# Patient Record
Sex: Female | Born: 1995 | Race: White | Marital: Single | Smoking: Never smoker
Health system: Southern US, Community
[De-identification: ages and names within clinical notes are randomized; demographics above are authoritative.]

## PROBLEM LIST (undated history)

## (undated) DIAGNOSIS — G43909 Migraine, unspecified, not intractable, without status migrainosus: Secondary | ICD-10-CM

## (undated) HISTORY — DX: Migraine, unspecified, not intractable, without status migrainosus: G43.909

## (undated) HISTORY — PX: EYE SURGERY: SHX253

---
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
## 2014-04-02 IMAGING — CR DG CHEST 2V
1 series · 2 of 2 positions shown · non-contrast
Comparison: none

REASON FOR EXAM: chest pain/chest wall pain
COMMENTS:   May transport without cardiac monitor

PROCEDURE:     DXR - DXR CHEST PA (OR AP) AND LATERAL  - April 22, 2013  [DATE]
RESULT:     The lungs are clear. The heart and pulmonary vessels are normal.
The bony and mediastinal structures are unremarkable. There is no effusion.
There is no pneumothorax or evidence of congestive failure.

[Series 1: w chest pa · 0.14mm/px · 2 of 2 slices shown]
[im 1/2]
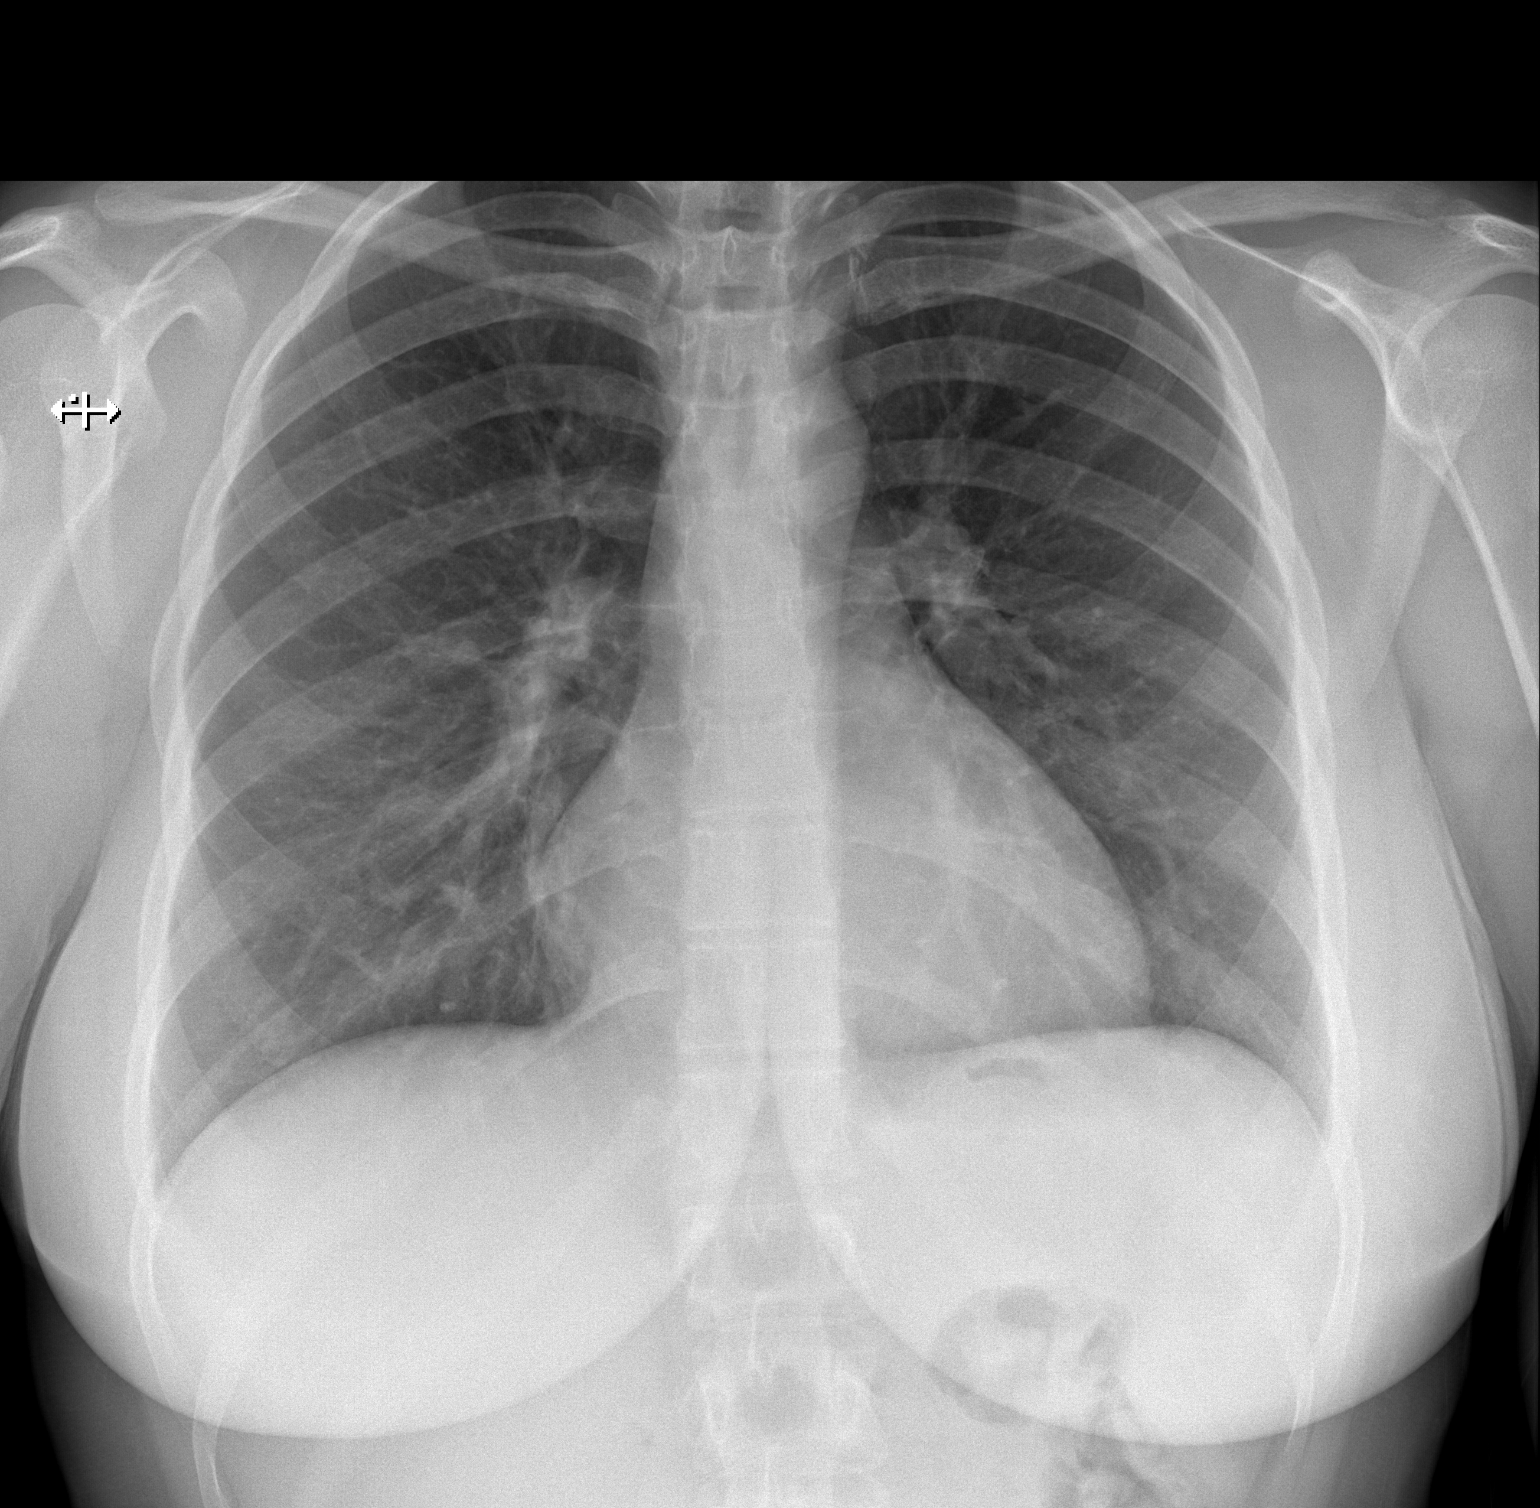
[im 2/2]
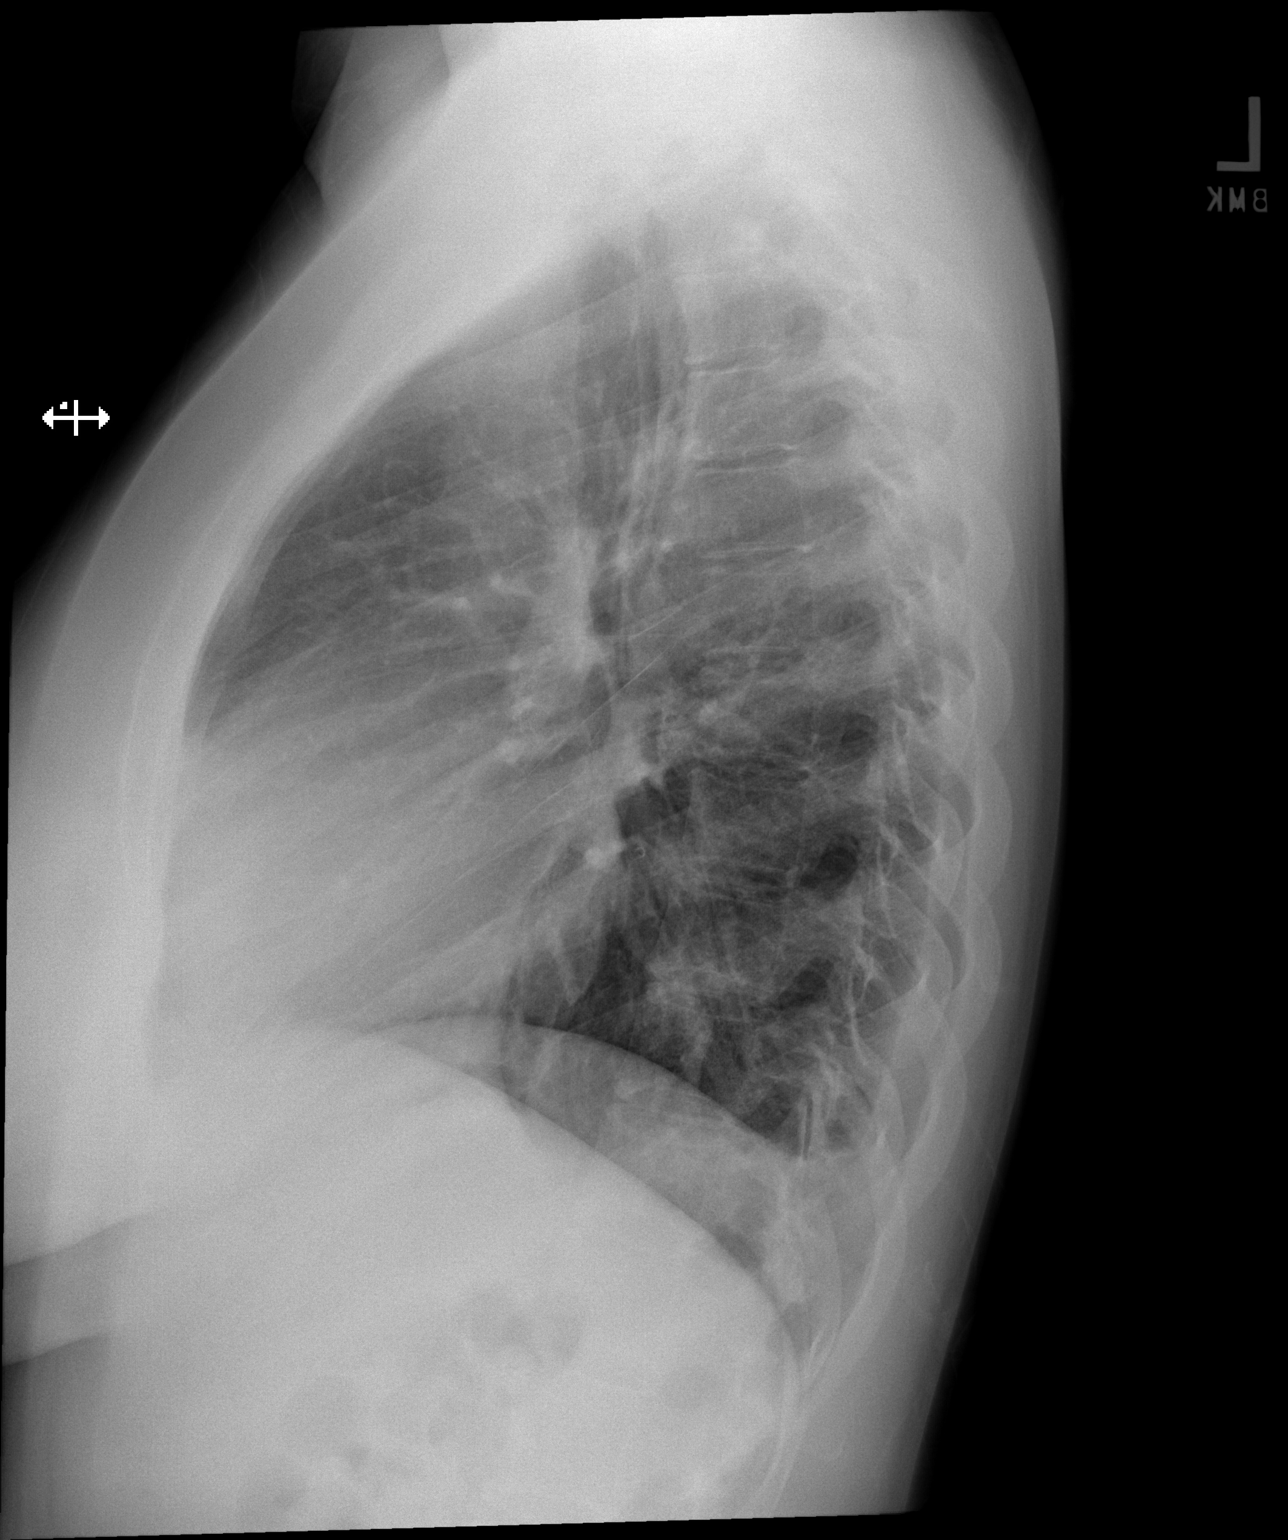

[2 of 2 positions shown; findings below may reference images not displayed]

IMPRESSION: No acute cardiopulmonary disease.

[REDACTED]

## 2019-11-04 ENCOUNTER — Encounter: Payer: Self-pay | Admitting: Physician Assistant

## 2019-11-04 ENCOUNTER — Ambulatory Visit (INDEPENDENT_AMBULATORY_CARE_PROVIDER_SITE_OTHER): Payer: 59 | Admitting: Physician Assistant

## 2019-11-04 ENCOUNTER — Other Ambulatory Visit: Payer: Self-pay

## 2019-11-04 ENCOUNTER — Encounter (INDEPENDENT_AMBULATORY_CARE_PROVIDER_SITE_OTHER): Payer: Self-pay

## 2019-11-04 DIAGNOSIS — D485 Neoplasm of uncertain behavior of skin: Secondary | ICD-10-CM | POA: Diagnosis not present

## 2019-11-04 DIAGNOSIS — D229 Melanocytic nevi, unspecified: Secondary | ICD-10-CM

## 2019-11-04 DIAGNOSIS — Z1283 Encounter for screening for malignant neoplasm of skin: Secondary | ICD-10-CM | POA: Diagnosis not present

## 2019-11-04 HISTORY — DX: Melanocytic nevi, unspecified: D22.9

## 2019-11-04 NOTE — Progress Notes (Addendum)
New Patient Visit  Subjective  Abigail Mayo is a 25 y.o. female who presents for the following: Skin Problem (Here to check spot on left hand x 2 years. No bleeding, pain, itch. Just there. ). 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 It hasn't changed. It is not sore and has not bled. She does feel like it itches on occasion.   Follow up Visit  Subjective  Abigail Mayo is a 24 y.o. female who presents for the following: Skin Problem (Here to check spot on left hand x 2 years. No bleeding, pain, itch. Just there. ).  Objective  Well appearing patient in no apparent distress; mood and affect are within normal limits.  All skin waist up examined not including scalp. No suspicious moles noted on back.   Objective  Left Hand - Posterior: 4.5 to mid pip     Assessment & Plan  Neoplasm of uncertain behavior of skin Left Hand - Posterior  Skin / nail biopsy Type of biopsy: tangential   Informed consent: discussed and consent obtained   Procedure prep:  Patient was prepped and draped in usual sterile fashion (Non sterile) Prep type:  Chlorhexidine Anesthesia: the lesion was anesthetized in a standard fashion   Anesthetic:  1% lidocaine w/ epinephrine 1-100,000 local infiltration Instrument used: flexible razor blade   Hemostasis achieved with: ferric subsulfate   Outcome: patient tolerated procedure well    Specimen 1 - Surgical pathology Differential Diagnosis: atypia Check Margins: No    New Patient Visit  Subjective  Abigail Mayo is a 24 y.o. female who presents for the following: Skin Problem (Here to check spot on left hand x 2 years. No bleeding, pain, itch. Just there. ).  Objective  Well appearing patient in no apparent distress; mood and affect are within normal limits.  A full examination was performed including scalp, head, eyes, ears, nose, lips, neck, chest, axillae, abdomen, back, buttocks, bilateral upper  extremities, bilateral lower extremities, hands, feet, fingers, toes, fingernails, and toenails. All findings within normal limits unless otherwise noted below.  Objective  Left Hand - Posterior: 4.5 to mid pip     Assessment & Plan  Neoplasm of uncertain behavior of skin Left Hand - Posterior  Skin / nail biopsy Type of biopsy: tangential   Informed consent: discussed and consent obtained   Procedure prep:  Patient was prepped and draped in usual sterile fashion (Non sterile) Prep type:  Chlorhexidine Anesthesia: the lesion was anesthetized in a standard fashion   Anesthetic:  1% lidocaine w/ epinephrine 1-100,000 local infiltration Instrument used: flexible razor blade   Hemostasis achieved with: ferric subsulfate   Outcome: patient tolerated procedure well    Specimen 1 - Surgical pathology Differential Diagnosis: atypia Check Margins: No   Objective  Well appearing patient in no apparent distress; mood and affect are within normal limits.  All skin waist up examined not including scalp. No suspicious moles noted on back.  Objective  Left Hand - Posterior: 4.5 to mid pip     Assessment & Plan  Neoplasm of uncertain behavior of skin Left Hand - Posterior  Skin / nail biopsy Type of biopsy: tangential   Informed consent: discussed and consent obtained   Procedure prep:  Patient was prepped and draped in usual sterile fashion (Non sterile) Prep type:  Chlorhexidine Anesthesia: the lesion was anesthetized in a standard fashion   Anesthetic:  1% lidocaine w/ epinephrine  1-100,000 local infiltration Instrument used: flexible razor blade   Hemostasis achieved with: ferric subsulfate   Outcome: patient tolerated procedure well    Specimen 1 - Surgical pathology Differential Diagnosis: atypia Check Margins: No

## 2019-11-04 NOTE — Patient Instructions (Signed)

## 2019-11-11 ENCOUNTER — Telehealth: Payer: Self-pay | Admitting: *Deleted

## 2019-11-11 ENCOUNTER — Encounter: Payer: Self-pay | Admitting: *Deleted

## 2019-11-11 NOTE — Telephone Encounter (Signed)
-----   Message from Arlyss Gandy, [place] sent at 11/11/2019  9:57 AM EDT ----- Skin , left hand posterior ATYPICAL SPITZ TUMOR  Send for [facility]

## 2019-11-14 ENCOUNTER — Encounter: Payer: Self-pay | Admitting: *Deleted

## 2019-11-14 NOTE — Telephone Encounter (Signed)
Pathology results to patient, will refer for [facility]

## 2019-11-14 NOTE — Telephone Encounter (Signed)
-----   Message from Arlyss Gandy, [place] sent at 11/11/2019  9:57 AM EDT ----- Skin , left hand posterior ATYPICAL SPITZ TUMOR  Send for [facility]

## 2019-12-22 DIAGNOSIS — L989 Disorder of the skin and subcutaneous tissue, unspecified: Secondary | ICD-10-CM | POA: Diagnosis not present

## 2019-12-22 DIAGNOSIS — D485 Neoplasm of uncertain behavior of skin: Secondary | ICD-10-CM | POA: Diagnosis not present

## 2020-01-04 ENCOUNTER — Encounter: Payer: Self-pay | Admitting: *Deleted

## 2020-01-26 DIAGNOSIS — F4323 Adjustment disorder with mixed anxiety and depressed mood: Secondary | ICD-10-CM | POA: Diagnosis not present

## 2020-02-15 DIAGNOSIS — F4323 Adjustment disorder with mixed anxiety and depressed mood: Secondary | ICD-10-CM | POA: Diagnosis not present

## 2020-02-27 DIAGNOSIS — F4323 Adjustment disorder with mixed anxiety and depressed mood: Secondary | ICD-10-CM | POA: Diagnosis not present

## 2020-03-12 DIAGNOSIS — F4323 Adjustment disorder with mixed anxiety and depressed mood: Secondary | ICD-10-CM | POA: Diagnosis not present

## 2020-04-17 DIAGNOSIS — K219 Gastro-esophageal reflux disease without esophagitis: Secondary | ICD-10-CM | POA: Diagnosis not present

## 2020-06-18 DIAGNOSIS — B9689 Other specified bacterial agents as the cause of diseases classified elsewhere: Secondary | ICD-10-CM | POA: Diagnosis not present

## 2020-06-18 DIAGNOSIS — J208 Acute bronchitis due to other specified organisms: Secondary | ICD-10-CM | POA: Diagnosis not present

## 2020-09-20 DIAGNOSIS — M9901 Segmental and somatic dysfunction of cervical region: Secondary | ICD-10-CM | POA: Diagnosis not present

## 2020-09-20 DIAGNOSIS — M546 Pain in thoracic spine: Secondary | ICD-10-CM | POA: Diagnosis not present

## 2020-09-20 DIAGNOSIS — M9905 Segmental and somatic dysfunction of pelvic region: Secondary | ICD-10-CM | POA: Diagnosis not present

## 2020-09-20 DIAGNOSIS — M9902 Segmental and somatic dysfunction of thoracic region: Secondary | ICD-10-CM | POA: Diagnosis not present

## 2020-09-24 DIAGNOSIS — M9901 Segmental and somatic dysfunction of cervical region: Secondary | ICD-10-CM | POA: Diagnosis not present

## 2020-09-24 DIAGNOSIS — M9905 Segmental and somatic dysfunction of pelvic region: Secondary | ICD-10-CM | POA: Diagnosis not present

## 2020-09-24 DIAGNOSIS — M9902 Segmental and somatic dysfunction of thoracic region: Secondary | ICD-10-CM | POA: Diagnosis not present

## 2020-09-24 DIAGNOSIS — M546 Pain in thoracic spine: Secondary | ICD-10-CM | POA: Diagnosis not present

## 2020-09-26 DIAGNOSIS — M9902 Segmental and somatic dysfunction of thoracic region: Secondary | ICD-10-CM | POA: Diagnosis not present

## 2020-09-26 DIAGNOSIS — M546 Pain in thoracic spine: Secondary | ICD-10-CM | POA: Diagnosis not present

## 2020-09-26 DIAGNOSIS — M9905 Segmental and somatic dysfunction of pelvic region: Secondary | ICD-10-CM | POA: Diagnosis not present

## 2020-09-26 DIAGNOSIS — M9901 Segmental and somatic dysfunction of cervical region: Secondary | ICD-10-CM | POA: Diagnosis not present

## 2020-10-01 DIAGNOSIS — M9901 Segmental and somatic dysfunction of cervical region: Secondary | ICD-10-CM | POA: Diagnosis not present

## 2020-10-01 DIAGNOSIS — M9902 Segmental and somatic dysfunction of thoracic region: Secondary | ICD-10-CM | POA: Diagnosis not present

## 2020-10-01 DIAGNOSIS — M9905 Segmental and somatic dysfunction of pelvic region: Secondary | ICD-10-CM | POA: Diagnosis not present

## 2020-10-01 DIAGNOSIS — M546 Pain in thoracic spine: Secondary | ICD-10-CM | POA: Diagnosis not present

## 2020-10-03 DIAGNOSIS — Z6838 Body mass index (BMI) 38.0-38.9, adult: Secondary | ICD-10-CM | POA: Diagnosis not present

## 2020-10-03 DIAGNOSIS — R002 Palpitations: Secondary | ICD-10-CM | POA: Diagnosis not present

## 2020-10-03 DIAGNOSIS — F419 Anxiety disorder, unspecified: Secondary | ICD-10-CM | POA: Diagnosis not present

## 2020-10-15 DIAGNOSIS — M9901 Segmental and somatic dysfunction of cervical region: Secondary | ICD-10-CM | POA: Diagnosis not present

## 2020-10-15 DIAGNOSIS — M9902 Segmental and somatic dysfunction of thoracic region: Secondary | ICD-10-CM | POA: Diagnosis not present

## 2020-10-15 DIAGNOSIS — M9905 Segmental and somatic dysfunction of pelvic region: Secondary | ICD-10-CM | POA: Diagnosis not present

## 2020-10-15 DIAGNOSIS — M546 Pain in thoracic spine: Secondary | ICD-10-CM | POA: Diagnosis not present

## 2020-11-02 DIAGNOSIS — M546 Pain in thoracic spine: Secondary | ICD-10-CM | POA: Diagnosis not present

## 2020-11-02 DIAGNOSIS — M9901 Segmental and somatic dysfunction of cervical region: Secondary | ICD-10-CM | POA: Diagnosis not present

## 2020-11-02 DIAGNOSIS — M9902 Segmental and somatic dysfunction of thoracic region: Secondary | ICD-10-CM | POA: Diagnosis not present

## 2020-11-02 DIAGNOSIS — M9905 Segmental and somatic dysfunction of pelvic region: Secondary | ICD-10-CM | POA: Diagnosis not present

## 2020-11-22 DIAGNOSIS — M9902 Segmental and somatic dysfunction of thoracic region: Secondary | ICD-10-CM | POA: Diagnosis not present

## 2020-11-22 DIAGNOSIS — M546 Pain in thoracic spine: Secondary | ICD-10-CM | POA: Diagnosis not present

## 2020-11-22 DIAGNOSIS — M9901 Segmental and somatic dysfunction of cervical region: Secondary | ICD-10-CM | POA: Diagnosis not present

## 2020-11-22 DIAGNOSIS — M9905 Segmental and somatic dysfunction of pelvic region: Secondary | ICD-10-CM | POA: Diagnosis not present

## 2020-11-28 DIAGNOSIS — F419 Anxiety disorder, unspecified: Secondary | ICD-10-CM | POA: Diagnosis not present

## 2020-11-28 DIAGNOSIS — R002 Palpitations: Secondary | ICD-10-CM | POA: Diagnosis not present

## 2020-12-06 DIAGNOSIS — M9901 Segmental and somatic dysfunction of cervical region: Secondary | ICD-10-CM | POA: Diagnosis not present

## 2020-12-06 DIAGNOSIS — M9902 Segmental and somatic dysfunction of thoracic region: Secondary | ICD-10-CM | POA: Diagnosis not present

## 2020-12-06 DIAGNOSIS — M9905 Segmental and somatic dysfunction of pelvic region: Secondary | ICD-10-CM | POA: Diagnosis not present

## 2020-12-06 DIAGNOSIS — M546 Pain in thoracic spine: Secondary | ICD-10-CM | POA: Diagnosis not present

## 2020-12-24 DIAGNOSIS — R002 Palpitations: Secondary | ICD-10-CM | POA: Diagnosis not present

## 2020-12-24 DIAGNOSIS — F411 Generalized anxiety disorder: Secondary | ICD-10-CM | POA: Diagnosis not present

## 2020-12-25 DIAGNOSIS — R9431 Abnormal electrocardiogram [ECG] [EKG]: Secondary | ICD-10-CM | POA: Diagnosis not present

## 2020-12-27 DIAGNOSIS — M9901 Segmental and somatic dysfunction of cervical region: Secondary | ICD-10-CM | POA: Diagnosis not present

## 2020-12-27 DIAGNOSIS — M9902 Segmental and somatic dysfunction of thoracic region: Secondary | ICD-10-CM | POA: Diagnosis not present

## 2020-12-27 DIAGNOSIS — M546 Pain in thoracic spine: Secondary | ICD-10-CM | POA: Diagnosis not present

## 2020-12-27 DIAGNOSIS — M9905 Segmental and somatic dysfunction of pelvic region: Secondary | ICD-10-CM | POA: Diagnosis not present

## 2021-01-10 DIAGNOSIS — M9905 Segmental and somatic dysfunction of pelvic region: Secondary | ICD-10-CM | POA: Diagnosis not present

## 2021-01-10 DIAGNOSIS — M9901 Segmental and somatic dysfunction of cervical region: Secondary | ICD-10-CM | POA: Diagnosis not present

## 2021-01-10 DIAGNOSIS — M9902 Segmental and somatic dysfunction of thoracic region: Secondary | ICD-10-CM | POA: Diagnosis not present

## 2021-01-10 DIAGNOSIS — M546 Pain in thoracic spine: Secondary | ICD-10-CM | POA: Diagnosis not present

## 2021-01-22 DIAGNOSIS — M9905 Segmental and somatic dysfunction of pelvic region: Secondary | ICD-10-CM | POA: Diagnosis not present

## 2021-01-22 DIAGNOSIS — M9901 Segmental and somatic dysfunction of cervical region: Secondary | ICD-10-CM | POA: Diagnosis not present

## 2021-01-22 DIAGNOSIS — M9902 Segmental and somatic dysfunction of thoracic region: Secondary | ICD-10-CM | POA: Diagnosis not present

## 2021-01-22 DIAGNOSIS — M546 Pain in thoracic spine: Secondary | ICD-10-CM | POA: Diagnosis not present

## 2021-02-06 DIAGNOSIS — M9901 Segmental and somatic dysfunction of cervical region: Secondary | ICD-10-CM | POA: Diagnosis not present

## 2021-02-06 DIAGNOSIS — M9902 Segmental and somatic dysfunction of thoracic region: Secondary | ICD-10-CM | POA: Diagnosis not present

## 2021-02-06 DIAGNOSIS — M9905 Segmental and somatic dysfunction of pelvic region: Secondary | ICD-10-CM | POA: Diagnosis not present

## 2021-02-06 DIAGNOSIS — M546 Pain in thoracic spine: Secondary | ICD-10-CM | POA: Diagnosis not present

## 2021-02-07 DIAGNOSIS — R002 Palpitations: Secondary | ICD-10-CM | POA: Diagnosis not present

## 2021-02-12 DIAGNOSIS — M9901 Segmental and somatic dysfunction of cervical region: Secondary | ICD-10-CM | POA: Diagnosis not present

## 2021-02-12 DIAGNOSIS — M9902 Segmental and somatic dysfunction of thoracic region: Secondary | ICD-10-CM | POA: Diagnosis not present

## 2021-02-12 DIAGNOSIS — M9905 Segmental and somatic dysfunction of pelvic region: Secondary | ICD-10-CM | POA: Diagnosis not present

## 2021-02-12 DIAGNOSIS — M546 Pain in thoracic spine: Secondary | ICD-10-CM | POA: Diagnosis not present

## 2021-02-19 DIAGNOSIS — M9905 Segmental and somatic dysfunction of pelvic region: Secondary | ICD-10-CM | POA: Diagnosis not present

## 2021-02-19 DIAGNOSIS — M9901 Segmental and somatic dysfunction of cervical region: Secondary | ICD-10-CM | POA: Diagnosis not present

## 2021-02-19 DIAGNOSIS — M9902 Segmental and somatic dysfunction of thoracic region: Secondary | ICD-10-CM | POA: Diagnosis not present

## 2021-02-19 DIAGNOSIS — I493 Ventricular premature depolarization: Secondary | ICD-10-CM | POA: Diagnosis not present

## 2021-02-19 DIAGNOSIS — I491 Atrial premature depolarization: Secondary | ICD-10-CM | POA: Diagnosis not present

## 2021-02-19 DIAGNOSIS — M546 Pain in thoracic spine: Secondary | ICD-10-CM | POA: Diagnosis not present

## 2021-08-14 DIAGNOSIS — Z0000 Encounter for general adult medical examination without abnormal findings: Secondary | ICD-10-CM | POA: Diagnosis not present

## 2021-08-14 DIAGNOSIS — Z6838 Body mass index (BMI) 38.0-38.9, adult: Secondary | ICD-10-CM | POA: Diagnosis not present

## 2021-08-26 DIAGNOSIS — S39012A Strain of muscle, fascia and tendon of lower back, initial encounter: Secondary | ICD-10-CM | POA: Diagnosis not present
